# Patient Record
Sex: Female | Born: 1985 | Race: White | Hispanic: No | Marital: Single | Smoking: Current every day smoker
Health system: Southern US, Community
[De-identification: ages and names within clinical notes are randomized; demographics above are authoritative.]

## PROBLEM LIST (undated history)

## (undated) DIAGNOSIS — G43909 Migraine, unspecified, not intractable, without status migrainosus: Secondary | ICD-10-CM

## (undated) DIAGNOSIS — M549 Dorsalgia, unspecified: Secondary | ICD-10-CM

## (undated) DIAGNOSIS — M419 Scoliosis, unspecified: Secondary | ICD-10-CM

## (undated) HISTORY — PX: FOOT SURGERY: SHX648

---

## 2011-03-26 ENCOUNTER — Emergency Department (HOSPITAL_BASED_OUTPATIENT_CLINIC_OR_DEPARTMENT_OTHER)
Admission: EM | Admit: 2011-03-26 | Discharge: 2011-03-26 | Disposition: A | Discharge status: Home / Self Care | Payer: Self-pay | Attending: Emergency Medicine | Admitting: Emergency Medicine

## 2011-03-26 DIAGNOSIS — L03119 Cellulitis of unspecified part of limb: Secondary | ICD-10-CM | POA: Insufficient documentation

## 2011-03-26 DIAGNOSIS — L02219 Cutaneous abscess of trunk, unspecified: Secondary | ICD-10-CM | POA: Insufficient documentation

## 2011-03-26 DIAGNOSIS — L02419 Cutaneous abscess of limb, unspecified: Secondary | ICD-10-CM | POA: Insufficient documentation

## 2011-03-26 DIAGNOSIS — F17200 Nicotine dependence, unspecified, uncomplicated: Secondary | ICD-10-CM | POA: Insufficient documentation

## 2011-03-26 DIAGNOSIS — L03319 Cellulitis of trunk, unspecified: Secondary | ICD-10-CM | POA: Insufficient documentation

## 2011-05-18 ENCOUNTER — Encounter: Payer: Self-pay | Admitting: *Deleted

## 2011-05-18 ENCOUNTER — Emergency Department (HOSPITAL_BASED_OUTPATIENT_CLINIC_OR_DEPARTMENT_OTHER)
Admission: EM | Admit: 2011-05-18 | Discharge: 2011-05-18 | Disposition: A | Discharge status: Home / Self Care | Payer: Self-pay | Attending: Emergency Medicine | Admitting: Emergency Medicine

## 2011-05-18 DIAGNOSIS — F17200 Nicotine dependence, unspecified, uncomplicated: Secondary | ICD-10-CM | POA: Insufficient documentation

## 2011-05-18 DIAGNOSIS — L089 Local infection of the skin and subcutaneous tissue, unspecified: Secondary | ICD-10-CM | POA: Insufficient documentation

## 2011-05-18 MED ORDER — HYDROCODONE-ACETAMINOPHEN 5-325 MG PO TABS: 2.0000 | ORAL_TABLET | ORAL | Status: AC | PRN

## 2011-05-18 MED ORDER — SULFAMETHOXAZOLE-TRIMETHOPRIM 800-160 MG PO TABS: 1.0000 | ORAL_TABLET | Freq: Two times a day (BID) | ORAL | Status: AC

## 2011-05-18 NOTE — ED Provider Notes (Signed)
History     CSN: [id number] Arrival date & time: 05/18/2011  7:53 PM  Chief Complaint  Patient presents with  . Abscess   Patient is a 25 y.o. female presenting with abscess. The history is provided by the patient.  Abscess  This is a new problem. The current episode started more than one week ago. The problem occurs frequently. The problem has been gradually improving. The abscess is present on the right lower leg and left lower leg. The abscess is characterized by itchiness and redness. The patient was exposed to OTC medications. There were no sick contacts. She has received no recent medical care.  Pt complains of red swollen oozing lesions on lower legs,  Pt has had drainage from areas.  Areas are spreading  History reviewed. No pertinent past medical history.  History reviewed. No pertinent past surgical history.  History reviewed. No pertinent family history.  History  Substance Use Topics  . Smoking status: Current Everyday Smoker -- 0.5 packs/day  . Smokeless tobacco: Not on file  . Alcohol Use: No    OB History    Grav Para Term Preterm Abortions TAB SAB Ect Mult Living                  Review of Systems  Musculoskeletal: Positive for myalgias.  Skin: Positive for rash.  All other systems reviewed and are negative.    Physical Exam  BP 120/77  Pulse 104  Temp(Src) 98.6 F (37 C) (Oral)  Resp 16  Wt 125 lb (56.7 kg)  LMP 03/05/2011  Physical Exam  Nursing note and vitals reviewed. Constitutional: She appears well-developed and well-nourished.  HENT:  Head: Normocephalic.  Neck: Normal range of motion.  Cardiovascular: Normal rate.   Pulmonary/Chest: Effort normal.  Abdominal: Soft.  Musculoskeletal: Normal range of motion.       Pt has multiple pustules,  Several larger red areas  Skin: There is erythema.  Psychiatric: She has a normal mood and affect.    ED Course  Procedures  MDM Culture obtained,  I think this is probably mrsa,  I would  like for pt to be rechecked,  She does not have a primary MD.  I advised followup at [facility] in 2 days      Langston Masker, [place] 05/18/11 2043

## 2011-05-18 NOTE — ED Notes (Signed)
Pt c/o several abscess to legs and abd x 4 days

## 2011-05-22 LAB — CULTURE, ROUTINE-ABSCESS: Gram Stain: NONE SEEN

## 2011-06-17 NOTE — ED Provider Notes (Signed)
History     CSN: [id number] Arrival date & time: 05/18/2011  7:53 PM  Chief Complaint  Patient presents with  . Abscess   HPI  History reviewed. No pertinent past medical history.  History reviewed. No pertinent past surgical history.  History reviewed. No pertinent family history.  History  Substance Use Topics  . Smoking status: Current Everyday Smoker -- 0.5 packs/day  . Smokeless tobacco: Not on file  . Alcohol Use: No    OB History    Grav Para Term Preterm Abortions TAB SAB Ect Mult Living                  Review of Systems  Physical Exam  BP 120/77  Pulse 104  Temp(Src) 98.6 F (37 C) (Oral)  Resp 16  Wt 125 lb (56.7 kg)  LMP 03/05/2011  Physical Exam  ED Course  Procedures  MDM Medical screening examination/treatment/procedure(s) were performed by non-physician practitioner and as supervising physician I was immediately available for consultation/collaboration.       Ademola Vert A. Patrica Duel, MD 06/17/11 [phone]

## 2012-05-21 ENCOUNTER — Emergency Department (HOSPITAL_BASED_OUTPATIENT_CLINIC_OR_DEPARTMENT_OTHER): Payer: Self-pay

## 2012-05-21 ENCOUNTER — Emergency Department (HOSPITAL_BASED_OUTPATIENT_CLINIC_OR_DEPARTMENT_OTHER)
Admission: EM | Admit: 2012-05-21 | Discharge: 2012-05-21 | Disposition: A | Discharge status: Home / Self Care | Payer: Self-pay | Attending: Emergency Medicine | Admitting: Emergency Medicine

## 2012-05-21 ENCOUNTER — Encounter (HOSPITAL_BASED_OUTPATIENT_CLINIC_OR_DEPARTMENT_OTHER): Payer: Self-pay | Admitting: Emergency Medicine

## 2012-05-21 DIAGNOSIS — H53149 Visual discomfort, unspecified: Secondary | ICD-10-CM | POA: Insufficient documentation

## 2012-05-21 DIAGNOSIS — R112 Nausea with vomiting, unspecified: Secondary | ICD-10-CM | POA: Insufficient documentation

## 2012-05-21 DIAGNOSIS — R51 Headache: Secondary | ICD-10-CM | POA: Insufficient documentation

## 2012-05-21 HISTORY — DX: Migraine, unspecified, not intractable, without status migrainosus: G43.909

## 2012-05-21 MED ORDER — DIPHENHYDRAMINE HCL 50 MG/ML IJ SOLN
25.0000 mg | Freq: Once | INTRAMUSCULAR | Status: AC
  Administered 2012-05-21: 25 mg via INTRAVENOUS
  Filled 2012-05-21: qty 1

## 2012-05-21 MED ORDER — SODIUM CHLORIDE 0.9 % IV BOLUS (SEPSIS)
1000.0000 mL | Freq: Once | INTRAVENOUS | Status: AC
  Administered 2012-05-21: 1000 mL via INTRAVENOUS

## 2012-05-21 MED ORDER — DEXAMETHASONE SODIUM PHOSPHATE 10 MG/ML IJ SOLN
10.0000 mg | Freq: Once | INTRAMUSCULAR | Status: AC
  Administered 2012-05-21: 10 mg via INTRAVENOUS
  Filled 2012-05-21: qty 1

## 2012-05-21 MED ORDER — METOCLOPRAMIDE HCL 5 MG/ML IJ SOLN
10.0000 mg | Freq: Once | INTRAMUSCULAR | Status: AC
  Administered 2012-05-21: 10 mg via INTRAVENOUS
  Filled 2012-05-21: qty 2

## 2012-05-21 NOTE — ED Provider Notes (Signed)
History     CSN: [id number]  Arrival date & time 05/21/12  1210   First MD Initiated Contact with Patient 05/21/12 1254      Chief Complaint  Patient presents with  . Migraine    (Consider location/radiation/quality/duration/timing/severity/associated sxs/prior treatment) HPI Comments: Patient presents with one-week history of worsening migraine-type headache. She has a history of migraines in the past but they don't typically lasts as long. She has constant throbbing pain to the right side of her head. This is associated with nausea and vomiting. She also has photophobia. She denies any recent head trauma. Denies a fevers or chills. She's been taking over-the-counter medicines including Excedrin without relief. She also got a shot at her doctor's office on Friday with no improvement. She states that she has had migraines in the past but this feels little bit different and worse than her typical headaches. She has some pain going down toward the back of her head  Patient is a 26 y.o. female presenting with migraine. The history is provided by the patient.  Migraine This is a recurrent problem. Associated symptoms include headaches. Pertinent negatives include no chest pain, no abdominal pain and no shortness of breath.    Past Medical History  Diagnosis Date  . Migraine     No past surgical history on file.  No family history on file.  History  Substance Use Topics  . Smoking status: Current Everyday Smoker -- 0.5 packs/day  . Smokeless tobacco: Not on file  . Alcohol Use: No    OB History    Grav Para Term Preterm Abortions TAB SAB Ect Mult Living                  Review of Systems  Constitutional: Negative for fever, chills, diaphoresis and fatigue.  HENT: Negative for congestion, rhinorrhea and sneezing.   Eyes: Negative.   Respiratory: Negative for cough, chest tightness and shortness of breath.   Cardiovascular: Negative for chest pain and leg swelling.    Gastrointestinal: Positive for nausea and vomiting. Negative for abdominal pain, diarrhea and blood in stool.  Genitourinary: Negative for frequency, hematuria, flank pain and difficulty urinating.  Musculoskeletal: Negative for back pain and arthralgias.  Skin: Negative for rash.  Neurological: Positive for headaches. Negative for dizziness, speech difficulty, weakness and numbness.    Allergies  Review of patient's allergies indicates no known allergies.  Home Medications   Current Outpatient Rx  Name Route Sig Dispense Refill  . ASPIRIN-ACETAMINOPHEN-CAFFEINE 250-250-65 MG PO TABS Oral Take 1 tablet by mouth every 6 (six) hours as needed.    Marland Kitchen PSEUDOEPHEDRINE HCL 60 MG PO TABS Oral Take 60 mg by mouth every 4 (four) hours as needed.      BP 117/73  Pulse 87  Temp 98.3 F (36.8 C) (Oral)  Resp 16  Ht 5\' 7"  (1.702 m)  Wt 125 lb (56.7 kg)  BMI 19.58 kg/m2  SpO2 100%  LMP 05/14/2012  Physical Exam  Constitutional: She is oriented to person, place, and time. She appears well-developed and well-nourished.  HENT:  Head: Normocephalic and atraumatic.  Eyes: Pupils are equal, round, and reactive to light.  Neck: Normal range of motion. Neck supple.  Cardiovascular: Normal rate, regular rhythm and normal heart sounds.   Pulmonary/Chest: Effort normal and breath sounds normal. No respiratory distress. She has no wheezes. She has no rales. She exhibits no tenderness.  Abdominal: Soft. Bowel sounds are normal. There is no tenderness. There is no rebound  and no guarding.  Musculoskeletal: Normal range of motion. She exhibits no edema.  Lymphadenopathy:    She has no cervical adenopathy.  Neurological: She is alert and oriented to person, place, and time. She has normal strength. No cranial nerve deficit or sensory deficit. GCS eye subscore is 4. GCS verbal subscore is 5. GCS motor subscore is 6.       Finger to nose intact  Skin: Skin is warm and dry. No rash noted.  Psychiatric:  She has a normal mood and affect.    ED Course  Procedures (including critical care time)   Ct Head Wo Contrast  05/21/2012  *RADIOLOGY REPORT*  Clinical Data: Frontal headaches since Monday.  Nausea, vomiting. Photophobia.  History of migraines.  No known trauma or history of hypertension.  CT HEAD WITHOUT CONTRAST  Technique:  Contiguous axial images were obtained from the base of the skull through the vertex without contrast.  Comparison: None.  Findings: There is no intra or extra-axial fluid collection or mass lesion.  The basilar cisterns and ventricles have a normal appearance.  There is no CT evidence for acute infarction or hemorrhage.  Bone windows show no calvarial fracture.  Visualized paranasal and mastoid air cells are well-aerated.  IMPRESSION: Negative exam.  Original Report Authenticated By: Patterson Hammersmith, M.D.      1. Headache       MDM  Pt with headache different than her past migraines.  CT negative.  Feeling a little better after migraine cocktail.  I recommended LP to r/o SAH, meningitis, and pt initially agreed, but now is refusing.  I explained risks of not doing LP and she is still refusing.  Advised to f/u with her PMD tomorrow or return here if symptoms worsen or she changes her mind about LP        Rolan Bucco, MD 05/21/12 1419

## 2012-05-21 NOTE — ED Notes (Signed)
Pt refusing LP- Dr. Fredderick Phenix notified.

## 2012-05-21 NOTE — ED Notes (Addendum)
Pt having headache since Monday.  Actively N/V this week.  No blurry vision.  Pt states this headache is different.  Sensitive to light and sound.   Seen at doctors office Friday, received a shot but has not helped any.

## 2015-03-28 ENCOUNTER — Encounter (HOSPITAL_BASED_OUTPATIENT_CLINIC_OR_DEPARTMENT_OTHER): Payer: Self-pay

## 2015-03-28 ENCOUNTER — Emergency Department (HOSPITAL_BASED_OUTPATIENT_CLINIC_OR_DEPARTMENT_OTHER)
Admission: EM | Admit: 2015-03-28 | Discharge: 2015-03-28 | Disposition: A | Discharge status: Home / Self Care | Payer: Self-pay | Attending: Emergency Medicine | Admitting: Emergency Medicine

## 2015-03-28 ENCOUNTER — Emergency Department (HOSPITAL_BASED_OUTPATIENT_CLINIC_OR_DEPARTMENT_OTHER): Payer: Self-pay

## 2015-03-28 DIAGNOSIS — Z8679 Personal history of other diseases of the circulatory system: Secondary | ICD-10-CM | POA: Insufficient documentation

## 2015-03-28 DIAGNOSIS — Z720 Tobacco use: Secondary | ICD-10-CM | POA: Insufficient documentation

## 2015-03-28 DIAGNOSIS — M722 Plantar fascial fibromatosis: Secondary | ICD-10-CM | POA: Insufficient documentation

## 2015-03-28 DIAGNOSIS — M419 Scoliosis, unspecified: Secondary | ICD-10-CM | POA: Insufficient documentation

## 2015-03-28 HISTORY — DX: Dorsalgia, unspecified: M54.9

## 2015-03-28 HISTORY — DX: Scoliosis, unspecified: M41.9

## 2015-03-28 MED ORDER — NAPROXEN 500 MG PO TABS: 500.0000 mg | ORAL_TABLET | Freq: Two times a day (BID) | ORAL | Status: AC

## 2015-03-28 NOTE — ED Provider Notes (Signed)
CSN: [id number]     Arrival date & time 03/28/15  1311 History   First MD Initiated Contact with Patient 03/28/15 1319     Chief Complaint  Patient presents with  . Foot Injury     (Consider location/radiation/quality/duration/timing/severity/associated sxs/prior Treatment) HPI Comments: Pt was a water park all day prior to pain starting.  Stating she was barefoot all day on concrete and that night when got home developed pain in the foot that has continued to worsen and severe when attempting to walk.  Patient is a 29 y.o. female presenting with lower extremity pain. The history is provided by the patient.  Foot Pain This is a new problem. Episode onset: 6 days ago. The problem occurs constantly. The problem has not changed since onset.Associated symptoms comments: Pain in the left foot that is worse when trying to walk. The symptoms are aggravated by walking. The symptoms are relieved by rest. She has tried acetaminophen for the symptoms. The treatment provided no relief.    Past Medical History  Diagnosis Date  . Migraine   . Back pain   . Scoliosis    Past Surgical History  Procedure Laterality Date  . Foot surgery     No family history on file. History  Substance Use Topics  . Smoking status: Current Every Day Smoker -- 0.00 packs/day  . Smokeless tobacco: Not on file  . Alcohol Use: No   OB History    No data available     Review of Systems  All other systems reviewed and are negative.     Allergies  Review of patient's allergies indicates no known allergies.  Home Medications   Prior to Admission medications   Medication Sig Start Date End Date Taking? Authorizing Provider  naproxen (NAPROSYN) 500 MG tablet Take 1 tablet (500 mg total) by mouth 2 (two) times daily. 03/28/15   Gwyneth Sprout, MD   BP 119/78 mmHg  Pulse 88  Temp(Src) 98.3 F (36.8 C) (Oral)  Resp 18  Ht 5\' 6"  (1.676 m)  Wt 127 lb (57.607 kg)  BMI 20.51 kg/m2  SpO2 100%  LMP  02/03/2015 (LMP Unknown) Physical Exam  Constitutional: She is oriented to person, place, and time. She appears well-developed and well-nourished. No distress.  HENT:  Head: Normocephalic and atraumatic.  Cardiovascular: Normal rate.   Pulmonary/Chest: Effort normal.  Musculoskeletal:       Left foot: There is tenderness. There is normal range of motion, no bony tenderness, normal capillary refill and no deformity.       Feet:  Tenderness to palpation of the plantar fascia  Neurological: She is alert and oriented to person, place, and time.  Skin: Skin is warm and dry.  Psychiatric: She has a normal mood and affect. Her behavior is normal.  Nursing note and vitals reviewed.   ED Course  Procedures (including critical care time) Labs Review Labs Reviewed - No data to display  Imaging Review Dg Foot Complete Left  03/28/2015   CLINICAL DATA:  Trauma Pattricia Boss pool to left foot 1 week ago. Persistent pain and swelling.  EXAM: LEFT FOOT - COMPLETE 3+ VIEW  COMPARISON:  None.  FINDINGS: There is no evidence of fracture or dislocation. There is no evidence of arthropathy or other focal bone abnormality. Soft tissues are unremarkable.  IMPRESSION: Negative left foot radiographs.   Electronically Signed   By: Marin Roberts M.D.   On: 03/28/2015 13:52     EKG Interpretation None  MDM   Final diagnoses:  Plantar fasciitis   patient presenting with persistent left foot pain after she was at a water park all day barefoot it on the concrete. Pain is in the arch of the foot and on the top of the foot. Is much worse when attempting to walk. She has significant tenderness along the plantar fascia. Feel most likely the patient has plantar fasciitis. Imaging is negative. Otherwise normal appearance.  Encouraged patient to get arch support for her shoes and given naproxen.    Gwyneth Sprout, MD 03/28/15 1421

## 2015-03-28 NOTE — ED Notes (Signed)
Injured left foot 7 days ago

## 2015-03-28 NOTE — Discharge Instructions (Signed)
Plantar Fasciitis  Plantar fasciitis is a common condition that causes foot pain. It is soreness (inflammation) of the band of tough fibrous tissue on the bottom of the foot that runs from the heel bone (calcaneus) to the ball of the foot. The cause of this soreness may be from excessive standing, poor fitting shoes, running on hard surfaces, being overweight, having an abnormal walk, or overuse (this is common in runners) of the painful foot or feet. It is also common in aerobic exercise dancers and ballet dancers.  SYMPTOMS   Most people with plantar fasciitis complain of:   Severe pain in the morning on the bottom of their foot especially when taking the first steps out of bed. This pain recedes after a few minutes of walking.   Severe pain is experienced also during walking following a long period of inactivity.   Pain is worse when walking barefoot or up stairs  DIAGNOSIS    Your caregiver will diagnose this condition by examining and feeling your foot.   Special tests such as X-rays of your foot, are usually not needed.  PREVENTION    Consult a sports medicine professional before beginning a new exercise program.   Walking programs offer a good workout. With walking there is a lower chance of overuse injuries common to runners. There is less impact and less jarring of the joints.   Begin all new exercise programs slowly. If problems or pain develop, decrease the amount of time or distance until you are at a comfortable level.   Wear good shoes and replace them regularly.   Stretch your foot and the heel cords at the back of the ankle (Achilles tendon) both before and after exercise.   Run or exercise on even surfaces that are not hard. For example, asphalt is better than pavement.   Do not run barefoot on hard surfaces.   If using a treadmill, vary the incline.   Do not continue to workout if you have foot or joint problems. Seek professional help if they do not improve.  HOME CARE INSTRUCTIONS     Avoid activities that cause you pain until you recover.   Use ice or cold packs on the problem or painful areas after working out.   Only take over-the-counter or prescription medicines for pain, discomfort, or fever as directed by your caregiver.   Soft shoe inserts or athletic shoes with air or gel sole cushions may be helpful.   If problems continue or become more severe, consult a sports medicine caregiver or your own health care provider. Cortisone is a potent anti-inflammatory medication that may be injected into the painful area. You can discuss this treatment with your caregiver.  MAKE SURE YOU:    Understand these instructions.   Will watch your condition.   Will get help right away if you are not doing well or get worse.  Document Released: 06/15/2001 Document Revised: 12/13/2011 Document Reviewed: 08/14/2008  ExitCare Patient Information 2015 [employer]. This information is not intended to replace advice given to you by your health care provider. Make sure you discuss any questions you have with your health care provider.

## 2016-10-07 ENCOUNTER — Emergency Department (HOSPITAL_BASED_OUTPATIENT_CLINIC_OR_DEPARTMENT_OTHER): Payer: BLUE CROSS/BLUE SHIELD

## 2016-10-07 ENCOUNTER — Encounter (HOSPITAL_BASED_OUTPATIENT_CLINIC_OR_DEPARTMENT_OTHER): Payer: Self-pay | Admitting: *Deleted

## 2016-10-07 ENCOUNTER — Emergency Department (HOSPITAL_BASED_OUTPATIENT_CLINIC_OR_DEPARTMENT_OTHER)
Admission: EM | Admit: 2016-10-07 | Discharge: 2016-10-07 | Disposition: A | Discharge status: Home / Self Care | Payer: BLUE CROSS/BLUE SHIELD | Attending: Emergency Medicine | Admitting: Emergency Medicine

## 2016-10-07 DIAGNOSIS — J209 Acute bronchitis, unspecified: Secondary | ICD-10-CM

## 2016-10-07 DIAGNOSIS — R112 Nausea with vomiting, unspecified: Secondary | ICD-10-CM | POA: Diagnosis present

## 2016-10-07 DIAGNOSIS — F17200 Nicotine dependence, unspecified, uncomplicated: Secondary | ICD-10-CM | POA: Insufficient documentation

## 2016-10-07 MED ORDER — ONDANSETRON 8 MG PO TBDP: ORAL_TABLET | ORAL | 0 refills | Status: DC

## 2016-10-07 MED ORDER — AZITHROMYCIN 250 MG PO TABS: ORAL_TABLET | ORAL | 0 refills | Status: DC

## 2016-10-07 NOTE — ED Provider Notes (Signed)
[facility]   CSN: [id number] Arrival date & time: 10/07/16  1733     History   Chief Complaint Chief Complaint  Patient presents with  . Emesis    HPI Ashley Trevino is a 31 y.o. female.  Patient is a 30 year old female with past medical history of migraines, alcoholism. She presents for evaluation of chest congestion and cough that has been ongoing for the past 3 weeks. She was seen at an outside facility and given prednisone and an inhaler which has not helped.   The history is provided by the patient.  Emesis   This is a new problem. The current episode started less than 1 hour ago. The problem has not changed since onset.There has been no fever.    Past Medical History:  Diagnosis Date  . Back pain   . Migraine   . Scoliosis     There are no active problems to display for this patient.   Past Surgical History:  Procedure Laterality Date  . FOOT SURGERY      OB History    No data available       Home Medications    Prior to Admission medications   Medication Sig Start Date End Date Taking? Authorizing Provider  naproxen (NAPROSYN) 500 MG tablet Take 1 tablet (500 mg total) by mouth 2 (two) times daily. 03/28/15   Gwyneth Sprout, MD    Family History History reviewed. No pertinent family history.  Social History Social History  Substance Use Topics  . Smoking status: Current Every Day Smoker    Packs/day: 0.00  . Smokeless tobacco: Never Used  . Alcohol use No     Allergies   Patient has no known allergies.   Review of Systems Review of Systems  Gastrointestinal: Positive for vomiting.  All other systems reviewed and are negative.    Physical Exam Updated Vital Signs BP 129/91 (BP Location: Right Arm)   Pulse (!) 126   Temp 98.3 F (36.8 C) (Oral)   Resp 20   Ht 5\' 6"  (1.676 m)   Wt 125 lb (56.7 kg)   LMP 09/27/2016   SpO2 100%   BMI 20.18 kg/m   Physical Exam  Constitutional: She is oriented to  person, place, and time. She appears well-developed and well-nourished. No distress.  HENT:  Head: Normocephalic and atraumatic.  Mouth/Throat: Oropharynx is clear and moist.  Neck: Normal range of motion. Neck supple.  Cardiovascular: Normal rate and regular rhythm.  Exam reveals no gallop and no friction rub.   No murmur heard. Pulmonary/Chest: Effort normal and breath sounds normal. No respiratory distress. She has no wheezes.  Abdominal: Soft. Bowel sounds are normal. She exhibits no distension. There is no tenderness.  Musculoskeletal: Normal range of motion.  Neurological: She is alert and oriented to person, place, and time.  Skin: Skin is warm and dry. She is not diaphoretic.  Nursing note and vitals reviewed.    ED Treatments / Results  Labs (all labs ordered are listed, but only abnormal results are displayed) Labs Reviewed - No data to display  EKG  EKG Interpretation None       Radiology No results found.  Procedures Procedures (including critical care time)  Medications Ordered in ED Medications - No data to display   Initial Impression / Assessment and Plan / ED Course  I have reviewed the triage vital signs and the nursing notes.  Pertinent labs & imaging results that were available during  my care of the patient were reviewed by me and considered in my medical decision making (see chart for details).  Clinical Course     X-rays clear, however I will prescribe Zithromax as the patient reports symptoms persistently for the past 3 weeks. She appears well-hydrated and in no distress. She is to return as needed for any problems.  Final Clinical Impressions(s) / ED Diagnoses   Final diagnoses:  None    New Prescriptions New Prescriptions   No medications on file     Geoffery Lyonsouglas Tahira Olivarez, MD 10/07/16 1859

## 2016-10-07 NOTE — ED Notes (Signed)
ED Provider at bedside. 

## 2016-10-07 NOTE — ED Triage Notes (Signed)
Patient is alert and oriented x4.  She is complaining of nausea, vomiting and diarrhea that started 3 weeks ago.  She was recently seen in [place] and Dx with COPD.  Currently she rates her rib pain from coughing 10 of 10.

## 2016-10-07 NOTE — Discharge Instructions (Signed)
Zithromax as prescribed.  Zofran as prescribed as needed for nausea.  Continue over-the-counter medications as needed for symptomatic relief.  Follow-up with your primary Dr. if not improving in the next week.

## 2018-03-14 ENCOUNTER — Other Ambulatory Visit: Payer: Self-pay

## 2018-03-14 ENCOUNTER — Emergency Department (HOSPITAL_BASED_OUTPATIENT_CLINIC_OR_DEPARTMENT_OTHER)
Admission: EM | Admit: 2018-03-14 | Discharge: 2018-03-14 | Disposition: A | Discharge status: Home / Self Care | Payer: BLUE CROSS/BLUE SHIELD | Attending: Emergency Medicine | Admitting: Emergency Medicine

## 2018-03-14 ENCOUNTER — Encounter (HOSPITAL_BASED_OUTPATIENT_CLINIC_OR_DEPARTMENT_OTHER): Payer: Self-pay | Admitting: Emergency Medicine

## 2018-03-14 DIAGNOSIS — M7918 Myalgia, other site: Secondary | ICD-10-CM | POA: Diagnosis not present

## 2018-03-14 DIAGNOSIS — R21 Rash and other nonspecific skin eruption: Secondary | ICD-10-CM | POA: Diagnosis not present

## 2018-03-14 DIAGNOSIS — F17210 Nicotine dependence, cigarettes, uncomplicated: Secondary | ICD-10-CM | POA: Insufficient documentation

## 2018-03-14 DIAGNOSIS — L299 Pruritus, unspecified: Secondary | ICD-10-CM | POA: Diagnosis not present

## 2018-03-14 MED ORDER — DIPHENHYDRAMINE HCL 25 MG PO TABS: 25.0000 mg | ORAL_TABLET | ORAL | 0 refills | Status: AC | PRN

## 2018-03-14 MED ORDER — IBUPROFEN 800 MG PO TABS: 800.0000 mg | ORAL_TABLET | Freq: Three times a day (TID) | ORAL | 0 refills | Status: AC | PRN

## 2018-03-14 MED ORDER — DOXYCYCLINE HYCLATE 100 MG PO CAPS: 100.0000 mg | ORAL_CAPSULE | Freq: Two times a day (BID) | ORAL | 0 refills | Status: AC

## 2018-03-14 MED FILL — DOXYCYCLINE HYCLATE 100 MG: 100 | 10 days supply | Qty: 20 | Fill #0

## 2018-03-14 MED FILL — IBUPROFEN 800 MG TAB: 800 | 10 days supply | Qty: 30 | Fill #0

## 2018-03-14 MED FILL — BANOPHEN 25 MG CAPSULE: 25 | 17 days supply | Qty: 100 | Fill #0

## 2018-03-14 NOTE — ED Provider Notes (Signed)
[facility] EMERGENCY DEPARTMENT Provider Note   CSN: [id number] Arrival date & time: 03/14/18  1504     History   Chief Complaint Chief Complaint  Patient presents with  . Rash    HPI Ashley Trevino is a 32 y.o. female with a hx of tobacco abuse, back pain, scoliosis, and migraines who presents to the ED with complaints of rash for the past 1.5 weeks. Patient states she has had multiple small red areas to her entire body. Describes areas as somewhat itchy, she has been scratching, they are somewhat painful. She states they are waxing/waning. She has tried OTC topical hydrocortisone for these without significant relief.  Reports associated generalized achiness, she has felt warm at times but has not taken a temperature to evaluate for fever. She states that she has slept in a new location intermittently lately- family member's house, it is thought that the dog has fleas. No one else with similar rash. Her mother tried giving her amoxicillin for past few days without change, she states she thought it may help, no clear reason for this.  She has not noticed any tic exposure but states that she did go fishing within past 2 weeks so was in wooded area. Denies throat closing sensation, dyspnea, or chest pain.   HPI  Past Medical History:  Diagnosis Date  . Back pain   . Migraine   . Scoliosis     There are no active problems to display for this patient.   Past Surgical History:  Procedure Laterality Date  . FOOT SURGERY       OB History   None      Home Medications    Prior to Admission medications   Medication Sig Start Date End Date Taking? Authorizing Provider  naproxen (NAPROSYN) 500 MG tablet Take 1 tablet (500 mg total) by mouth 2 (two) times daily. 03/28/15   Gwyneth Sprout, MD  ondansetron (ZOFRAN ODT) 8 MG disintegrating tablet 8mg  ODT q4 hours prn nausea 10/07/16   Geoffery Lyons, MD    Family History No family history on file.  Social  History Social History   Tobacco Use  . Smoking status: Current Every Day Smoker    Packs/day: 0.00  . Smokeless tobacco: Never Used  Substance Use Topics  . Alcohol use: No  . Drug use: No     Allergies   Patient has no known allergies.   Review of Systems Review of Systems  Constitutional: Positive for fever ("felt warm" no measured temp). Negative for appetite change.  Respiratory: Negative for shortness of breath.   Cardiovascular: Negative for chest pain.  Musculoskeletal: Positive for myalgias.  Skin: Positive for rash.  Neurological: Negative for weakness, numbness and headaches.   Physical Exam Updated Vital Signs BP 132/88 (BP Location: Left Arm)   Pulse (!) 110   Temp 98.3 F (36.8 C) (Oral)   Resp 18   Ht 5\' 6"  (1.676 m)   Wt 52.2 kg (115 lb)   LMP 03/07/2018   SpO2 99%   BMI 18.56 kg/m   Physical Exam  Constitutional: She appears well-developed and well-nourished.  Non-toxic appearance. No distress.  HENT:  Head: Normocephalic and atraumatic.  Right Ear: Tympanic membrane normal. Tympanic membrane is not perforated, not erythematous, not retracted and not bulging.  Left Ear: Tympanic membrane normal. Tympanic membrane is not perforated, not erythematous, not retracted and not bulging.  Nose: Nose normal.  Mouth/Throat: Uvula is midline, oropharynx is clear and moist and mucous  membranes are normal.  No mucous membranes lesions. No sloughing. Patient tolerating her own secretions without difficulty. No trismus. No drooling. No hot potato voice. Airway is patent.   Eyes: Conjunctivae are normal. Right eye exhibits no discharge. Left eye exhibits no discharge.  Neck: Normal range of motion. Neck supple.  Cardiovascular: Normal rate and regular rhythm.  No murmur heard. Pulses:      Radial pulses are 2+ on the right side, and 2+ on the left side.       Dorsalis pedis pulses are 2+ on the right side, and 2+ on the left side.  Pulmonary/Chest: Effort  normal and breath sounds normal. No stridor. No respiratory distress. She has no wheezes. She has no rhonchi. She has no rales.  Abdominal: Soft. Normal appearance. She exhibits no distension. There is no tenderness.  Musculoskeletal:  No obvious deformities or appreciable swelling,. normal ROM. No midline tenderness to back.   Neurological: She is alert.  Clear speech.  Sensation grossly intact bilateral upper and lower extremities.  Patient has 5 out of 5 symmetric grip strength.  She has 5 out of 5 strength with plantar dorsiflexion.  Gait is steady and intact.  Skin: Skin is warm and dry. No petechiae and no purpura noted. Rash is not nodular, not pustular, not vesicular and not urticarial.  Patient has multiple scabbed over areas, somewhat papule like diffusely scattered to body (extremities, trunk, base of the neck), these do not extend to palms, soles, or inter-webspaces.  No mucous membrane involvement.  Some areas do appear scabbed over.  There is some mild surrounding redness at some sites that appear to have excoriations from scratching.  Not particularly burrowing appearance.  No overlying warmth.  Erythema blanches.  Psychiatric: She has a normal mood and affect. Her behavior is normal.  Nursing note and vitals reviewed.    ED Treatments / Results  Labs (all labs ordered are listed, but only abnormal results are displayed) Labs Reviewed - No data to display  EKG None  Radiology No results found.  Procedures Procedures (including critical care time)  Medications Ordered in ED Medications - No data to display   Initial Impression / Assessment and Plan / ED Course  I have reviewed the triage vital signs and the nursing notes.  Pertinent labs & imaging results that were available during my care of the patient were reviewed by me and considered in my medical decision making (see chart for details).   Patient presents with her mother for rash which has been ongoing for 1.5  weeks.  Patient nontoxic-appearing, no apparent distress, initial vitals notable for tachycardia, this normalized on my exam and with repeat vitals.  Patient has multiple erythematous papules, majority appear scabbed over.  These are pruritic.  Presentation somewhat suspicious for bedbugs, does not appear consistent with scabies, no inter-web space involvement, does not have burrowing appearance.  Recommended topical hydrocortisone cream, Benadryl as needed itching, and home hygiene to eradicate bedbugs.  However, given patient was in wooded areas,she has had some flu like sxs, will additionally cover for RMSF with Doxycycline. I discussed treatment plan, need for PCP follow-up, and return precautions with the patient. Provided opportunity for questions, patient confirmed understanding and is in agreement with plan.   Vitals:   03/14/18 1509 03/14/18 1656  BP: 132/88 113/72  Pulse: (!) 110 93  Resp: 18   Temp: 98.3 F (36.8 C)   TempSrc: Oral   SpO2: 99% 100%  Weight: 52.2 kg (115  lb)   Height: 5\' 6"  (1.676 m)     Final Clinical Impressions(s) / ED Diagnoses   Final diagnoses:  Rash    ED Discharge Orders        Ordered    doxycycline (VIBRAMYCIN) 100 MG capsule  2 times daily     03/14/18 1706    diphenhydrAMINE (BENADRYL) 25 MG tablet  Every 4 hours PRN     03/14/18 1706    ibuprofen (ADVIL,MOTRIN) 800 MG tablet  Every 8 hours PRN     03/14/18 1706       Brenda Cowher, EdgewaterSamantha R, PA-C 03/14/18 1824    Tegeler, Canary Brimhristopher J, MD 03/15/18 0040

## 2018-03-14 NOTE — ED Notes (Signed)
ED Provider at bedside. 

## 2018-03-14 NOTE — ED Triage Notes (Addendum)
Red itchy rash to entire body. States she thinks she has flea bites. Also states "I need to know why my veins are popping out" and c/o difficulty bending L arm. Pt states she has been taking old amoxicillin for 3 days.

## 2018-03-14 NOTE — Discharge Instructions (Signed)
You were seen in the emergency department today for a rash, this appears suspicious for bedbugs.  Be sure to wash all linens and clothing in hot water, additionally consider hiring an exterminator to eradicate the bedbugs.  Continue to apply topical hydrocortisone cream as needed for itching.  You may also take Benadryl every 4 hours as needed for itching.  Given you prescription for Benadryl we have also giving a prescription for ibuprofen, take this every 8 hours as needed for pain, do not take this is other NSAIDs, be sure to take this with food as it can cause stomach upset and at worst GI bleeds.  May take Tylenol per over-the-counter dosing with this medication safely.  Additionally we are covering your for possible rocky mountain spotted fever with doxycycline, this is an antibiotic. Please take all of your antibiotics until finished. You may develop abdominal discomfort or diarrhea from the antibiotic.  You may help offset this with probiotics which you can buy at the store (ask your pharmacist if unable to find) or get probiotics in the form of eating yogurt. Do not eat or take the probiotics until 2 hours after your antibiotic. If you are unable to tolerate these side effects follow-up with your primary care provider or return to the emergency department.   If you begin to experience any blistering, new rashes, swelling, or difficulty breathing seek medical care for evaluation of potentially more serious side effects.   Please be aware that this medication may interact with other medications you are taking, please be sure to discuss your medication list with your pharmacist.   Please take all medications as prescribed.  Please discuss all medications with your pharmacist regarding interactions with other medications and potential side effects.  Follow-up with your primary care provider of the [facility] in 3 to 5 days for reevaluation.  Return to the ER for new or worsening  symptoms or any other concerns that you may have.

## 2018-04-14 ENCOUNTER — Encounter (HOSPITAL_COMMUNITY): Payer: Self-pay | Admitting: Emergency Medicine

## 2018-04-14 ENCOUNTER — Other Ambulatory Visit: Payer: Self-pay

## 2018-04-14 ENCOUNTER — Emergency Department (HOSPITAL_COMMUNITY)
Admission: EM | Admit: 2018-04-14 | Discharge: 2018-04-14 | Disposition: A | Discharge status: Home / Self Care | Payer: BLUE CROSS/BLUE SHIELD | Attending: Emergency Medicine | Admitting: Emergency Medicine

## 2018-04-14 DIAGNOSIS — Z5321 Procedure and treatment not carried out due to patient leaving prior to being seen by health care provider: Secondary | ICD-10-CM | POA: Diagnosis not present

## 2018-04-14 DIAGNOSIS — R21 Rash and other nonspecific skin eruption: Secondary | ICD-10-CM | POA: Insufficient documentation

## 2018-04-14 NOTE — ED Notes (Addendum)
When staff told pt she was being placed in a room, pt stated that she would rather go home. This nurse told the pt if she can wait just a little bit longer, a physician would be seeing her in the room shortly. Pt stated "If I'm not dying like I thought I was then I'm leaving. I just want to get in my bed. My chest always hurts even if I'm not moving. I just want to go home.

## 2018-04-14 NOTE — ED Triage Notes (Signed)
Pt arriving with complaint of bed bug bites. Pt states she feels as if a bed bug may be in her ear. Pt reports seven dust was put down in order to control bed bugs ans she believes some of it got in her mouth. Pt reports taking Vivance (unknown amount) last night. Pt presenting with tremors, scrambled conversation, and paranoia.

## 2019-07-29 ENCOUNTER — Emergency Department (HOSPITAL_BASED_OUTPATIENT_CLINIC_OR_DEPARTMENT_OTHER): Payer: Self-pay

## 2019-07-29 ENCOUNTER — Emergency Department (HOSPITAL_BASED_OUTPATIENT_CLINIC_OR_DEPARTMENT_OTHER)
Admission: EM | Admit: 2019-07-29 | Discharge: 2019-07-30 | Disposition: A | Discharge status: Home / Self Care | Payer: Self-pay | Attending: Emergency Medicine | Admitting: Emergency Medicine

## 2019-07-29 ENCOUNTER — Other Ambulatory Visit: Payer: Self-pay

## 2019-07-29 ENCOUNTER — Encounter (HOSPITAL_BASED_OUTPATIENT_CLINIC_OR_DEPARTMENT_OTHER): Payer: Self-pay

## 2019-07-29 DIAGNOSIS — E876 Hypokalemia: Secondary | ICD-10-CM | POA: Insufficient documentation

## 2019-07-29 DIAGNOSIS — F17210 Nicotine dependence, cigarettes, uncomplicated: Secondary | ICD-10-CM | POA: Insufficient documentation

## 2019-07-29 DIAGNOSIS — F1390 Sedative, hypnotic, or anxiolytic use, unspecified, uncomplicated: Secondary | ICD-10-CM | POA: Insufficient documentation

## 2019-07-29 DIAGNOSIS — R252 Cramp and spasm: Secondary | ICD-10-CM | POA: Insufficient documentation

## 2019-07-29 DIAGNOSIS — K047 Periapical abscess without sinus: Secondary | ICD-10-CM | POA: Insufficient documentation

## 2019-07-29 DIAGNOSIS — T148XXA Other injury of unspecified body region, initial encounter: Secondary | ICD-10-CM

## 2019-07-29 DIAGNOSIS — R112 Nausea with vomiting, unspecified: Secondary | ICD-10-CM | POA: Insufficient documentation

## 2019-07-29 DIAGNOSIS — R233 Spontaneous ecchymoses: Secondary | ICD-10-CM | POA: Insufficient documentation

## 2019-07-29 DIAGNOSIS — F1190 Opioid use, unspecified, uncomplicated: Secondary | ICD-10-CM | POA: Insufficient documentation

## 2019-07-29 LAB — COMPREHENSIVE METABOLIC PANEL
ALT: 19 U/L (ref 0–44)
AST: 27 U/L (ref 15–41)
Albumin: 3 g/dL — ABNORMAL LOW (ref 3.5–5.0)
Alkaline Phosphatase: 71 U/L (ref 38–126)
Anion gap: 9 (ref 5–15)
BUN: 5 mg/dL — ABNORMAL LOW (ref 6–20)
CO2: 23 mmol/L (ref 22–32)
Calcium: 8.3 mg/dL — ABNORMAL LOW (ref 8.9–10.3)
Chloride: 105 mmol/L (ref 98–111)
Creatinine, Ser: 0.59 mg/dL (ref 0.44–1.00)
GFR calc Af Amer: >60 mL/min (ref >60)
GFR calc non Af Amer: >60 mL/min (ref >60)
Glucose, Bld: 107 mg/dL — ABNORMAL HIGH (ref 70–99)
Potassium: 2.9 mmol/L — ABNORMAL LOW (ref 3.5–5.1)
Sodium: 137 mmol/L (ref 135–145)
Total Bilirubin: 0.5 mg/dL (ref 0.3–1.2)
Total Protein: 5.8 g/dL — ABNORMAL LOW (ref 6.5–8.1)

## 2019-07-29 LAB — CBC WITH DIFFERENTIAL/PLATELET
Abs Immature Granulocytes: 0.02 10*3/uL (ref 0.00–0.07)
Basophils Absolute: 0.1 10*3/uL (ref 0.0–0.1)
Basophils Relative: 1 %
Eosinophils Absolute: 0.2 10*3/uL (ref 0.0–0.5)
Eosinophils Relative: 3 %
HCT: 41.3 % (ref 36.0–46.0)
Hemoglobin: 13 g/dL (ref 12.0–15.0)
Immature Granulocytes: 0 %
Lymphocytes Relative: 37 %
Lymphs Abs: 2.5 10*3/uL (ref 0.7–4.0)
MCH: 32.3 pg (ref 26.0–34.0)
MCHC: 31.5 g/dL (ref 30.0–36.0)
MCV: 102.5 fL — ABNORMAL HIGH (ref 80.0–100.0)
Monocytes Absolute: 0.4 10*3/uL (ref 0.1–1.0)
Monocytes Relative: 6 %
Neutro Abs: 3.7 10*3/uL (ref 1.7–7.7)
Neutrophils Relative %: 53 %
Platelets: 293 10*3/uL (ref 150–400)
RBC: 4.03 MIL/uL (ref 3.87–5.11)
RDW: 14.6 % (ref 11.5–15.5)
WBC: 7 10*3/uL (ref 4.0–10.5)
nRBC: 0 % (ref 0.0–0.2)

## 2019-07-29 LAB — URINALYSIS, ROUTINE W REFLEX MICROSCOPIC
Bilirubin Urine: NEGATIVE
Glucose, UA: NEGATIVE mg/dL
Hgb urine dipstick: NEGATIVE
Ketones, ur: NEGATIVE mg/dL
Leukocytes,Ua: NEGATIVE
Nitrite: NEGATIVE
Protein, ur: NEGATIVE mg/dL
Specific Gravity, Urine: 1.01 (ref 1.005–1.030)
pH: 8.5 — ABNORMAL HIGH (ref 5.0–8.0)

## 2019-07-29 LAB — RAPID URINE DRUG SCREEN, HOSP PERFORMED
Amphetamines: NOT DETECTED
Barbiturates: NOT DETECTED
Benzodiazepines: POSITIVE — AB
Cocaine: NOT DETECTED
Opiates: POSITIVE — AB
Tetrahydrocannabinol: NOT DETECTED

## 2019-07-29 LAB — MAGNESIUM: Magnesium: 1.9 mg/dL (ref 1.7–2.4)

## 2019-07-29 LAB — PREGNANCY, URINE: Preg Test, Ur: NEGATIVE

## 2019-07-29 LAB — PROTIME-INR
INR: 1 (ref 0.8–1.2)
Prothrombin Time: 13.1 seconds (ref 11.4–15.2)

## 2019-07-29 LAB — CK: Total CK: 97 U/L (ref 38–234)

## 2019-07-29 LAB — AMMONIA: Ammonia: 9 umol/L (ref 9–35)

## 2019-07-29 LAB — TROPONIN I (HIGH SENSITIVITY): Troponin I (High Sensitivity): 2 ng/L (ref <18)

## 2019-07-29 LAB — BRAIN NATRIURETIC PEPTIDE: B Natriuretic Peptide: 160.6 pg/mL — ABNORMAL HIGH (ref 0.0–100.0)

## 2019-07-29 LAB — LIPASE, BLOOD: Lipase: 30 U/L (ref 11–51)

## 2019-07-29 MED ORDER — ONDANSETRON 4 MG PO TBDP: 4.0000 mg | ORAL_TABLET | Freq: Three times a day (TID) | ORAL | 0 refills | Status: AC | PRN

## 2019-07-29 MED ORDER — POTASSIUM CHLORIDE CRYS ER 20 MEQ PO TBCR
40.0000 meq | EXTENDED_RELEASE_TABLET | Freq: Once | ORAL | Status: AC
  Administered 2019-07-29: 40 meq via ORAL
  Filled 2019-07-29: qty 2

## 2019-07-29 MED ORDER — POTASSIUM CHLORIDE CRYS ER 20 MEQ PO TBCR: 40.0000 meq | EXTENDED_RELEASE_TABLET | Freq: Every day | ORAL | 0 refills | Status: AC

## 2019-07-29 MED ORDER — ONDANSETRON HCL 4 MG/2ML IJ SOLN
4.0000 mg | Freq: Once | INTRAMUSCULAR | Status: AC
  Administered 2019-07-29: 23:00:00 4 mg via INTRAVENOUS
  Filled 2019-07-29: qty 2

## 2019-07-29 MED ORDER — SODIUM CHLORIDE 0.9 % IV BOLUS
1000.0000 mL | Freq: Once | INTRAVENOUS | Status: AC
  Administered 2019-07-29: 23:00:00 1000 mL via INTRAVENOUS

## 2019-07-29 NOTE — ED Notes (Signed)
Patient transported to X-ray & CT °

## 2019-07-29 NOTE — ED Notes (Signed)
Pt unable to provide enough urine to complete ordered testing. Encouraged to try again later. Will continue to monitor.

## 2019-07-29 NOTE — ED Triage Notes (Addendum)
Pt c/o swelling in bilateral LE for 3 weeks. Pt states it started after being treated for a dental abscess. Pt has associated muscle aches and bruising in bilateral LE. Pt states she has been having decreased appetite, and dizziness.

## 2019-07-29 NOTE — Discharge Instructions (Addendum)
You were seen in the ED today with nausea, vomiting, leg cramping, and bruising. Your labs show slightly low potassium which may be causing your symptoms. Please call the PCP listed to schedule the next available appointment. Return to the ED with any new or suddenly worsening symptoms.

## 2019-07-29 NOTE — ED Notes (Signed)
Pt given PO challenge.

## 2019-07-29 NOTE — ED Provider Notes (Signed)
Emergency Department Provider Note   I have reviewed the triage vital signs and the nursing notes.   HISTORY  Chief Complaint No chief complaint on file.   HPI Ashley Trevino is a 33 y.o. female with past history of migraine headache, poor dentition, and scoliosis presents to the emergency department with diffuse muscle cramping, bruising, bilateral leg swelling, lightheadedness.  Symptoms have been worsening abruptly over the past week.  She reports recent evaluation for chronic dental pain and abscess.  She states that she saw a dentist who started her on Augmentin for 2 weeks which she finished approximately 3 months ago.  She states that they wanted to pull multiple teeth but she did not have the money so continues to treat these abscesses by "popping them."  She denies current abscess but in the setting has developed diffuse muscle cramping and bruising.  She is not falling or injuring herself.  She is finding it increasingly painful to walk and feels lightheaded with standing.  She does report some intermittent chest discomfort but symptoms are not reproducible.  No pleuritic chest pain or shortness of breath.  She denies fevers.  Patient does not drink alcohol or use drugs.  She reports vomiting as well over the past week but is able to tolerate liquids.  She has been trying Zofran at home with no relief.  Denies tick bites.  Patient's bruising is mostly in the upper thighs but also over the abdomen and occasional bruising in the arms. No palms/soles involvement.    Past Medical History:  Diagnosis Date   Back pain    Migraine    Scoliosis     There are no active problems to display for this patient.   Past Surgical History:  Procedure Laterality Date   FOOT SURGERY      Allergies Patient has no known allergies.  No family history on file.  Social History Social History   Tobacco Use   Smoking status: Current Every Day Smoker    Packs/day: 0.00   Smokeless  tobacco: Never Used  Substance Use Topics   Alcohol use: No   Drug use: No    Review of Systems  Constitutional: No fever/chills. Positive weakness, muscle aches, and lightheadedness.  Eyes: No visual changes. ENT: No sore throat. Intermittent dental pain and abscess.  Cardiovascular: Intermittent chest pain. Respiratory: Denies shortness of breath. Gastrointestinal: No abdominal pain. Positive nausea and vomiting.  No diarrhea.  No constipation. Genitourinary: Negative for dysuria. Musculoskeletal: Diffuse muscle aches worse in the thighs.  Skin: Bruising in the bilateral thighs. Neurological: Negative for focal weakness or numbness. Tingling in the bilateral LEs. History of chronic headaches.   10-point ROS otherwise negative.  ____________________________________________   PHYSICAL EXAM:  VITAL SIGNS: ED Triage Vitals  Enc Vitals Group     BP 07/29/19 2127 111/78     Pulse Rate 07/29/19 2127 97     Resp 07/29/19 2127 20     Temp 07/29/19 2127 98.6 F (37 C)     Temp Source 07/29/19 2127 Oral     SpO2 07/29/19 2127 100 %     Weight 07/29/19 2129 125 lb (56.7 kg)     Height 07/29/19 2129 5\' 6"  (1.676 m)   Constitutional: Alert and oriented. Well appearing and in no acute distress. Eyes: Conjunctivae are normal. PERRL. EOMI. Head: Atraumatic. Nose: No congestion/rhinnorhea. Mouth/Throat: Mucous membranes are moist.   Neck: No stridor.  Cardiovascular: Normal rate, regular rhythm. Good peripheral circulation. Grossly normal  heart sounds. No murmurs.  Respiratory: Normal respiratory effort.  No retractions. Lungs CTAB. Gastrointestinal: Soft and nontender. No distention.  Musculoskeletal: Mild swelling in the bilateral LEs with tenderness to palpation of the thighs. No joint redness or swelling.  Neurologic:  Normal speech and language. Normal CN exam 2-12. Bilateral upper extremity tremor/asterixis type movement. Normal finger to nose despite. Decreased strength  throughout without unilateral weakness. Hesitant, wide based gait without assistance.  Skin:  Skin is warm and dry. Ecchymosis in the thighs and left upper arm.    ____________________________________________   LABS (all labs ordered are listed, but only abnormal results are displayed)  Labs Reviewed  COMPREHENSIVE METABOLIC PANEL - Abnormal; Notable for the following components:      Result Value   Potassium 2.9 (*)    Glucose, Bld 107 (*)    BUN 5 (*)    Calcium 8.3 (*)    Total Protein 5.8 (*)    Albumin 3.0 (*)    All other components within normal limits  CBC WITH DIFFERENTIAL/PLATELET - Abnormal; Notable for the following components:   MCV 102.5 (*)    All other components within normal limits  BRAIN NATRIURETIC PEPTIDE - Abnormal; Notable for the following components:   B Natriuretic Peptide 160.6 (*)    All other components within normal limits  URINALYSIS, ROUTINE W REFLEX MICROSCOPIC - Abnormal; Notable for the following components:   pH 8.5 (*)    All other components within normal limits  RAPID URINE DRUG SCREEN, HOSP PERFORMED - Abnormal; Notable for the following components:   Opiates POSITIVE (*)    Benzodiazepines POSITIVE (*)    All other components within normal limits  LIPASE, BLOOD  PROTIME-INR  MAGNESIUM  CK  AMMONIA  PREGNANCY, URINE  TSH  TROPONIN I (HIGH SENSITIVITY)  TROPONIN I (HIGH SENSITIVITY)   ____________________________________________  EKG   EKG Interpretation  Date/Time:  Sunday July 29 2019 22:42:46 EDT Ventricular Rate:  80 PR Interval:    QRS Duration: 107 QT Interval:  400 QTC Calculation: 462 R Axis:   32 Text Interpretation:  Sinus rhythm Borderline short PR interval RSR' in V1 or V2, probably normal variant Borderline T abnormalities, anterior leads No STEMI  Confirmed by Alona Bene [phone]) on 07/29/2019 11:20:32 PM       ____________________________________________  RADIOLOGY  Dg Chest 2 View  Result  Date: 07/29/2019 CLINICAL DATA:  Chest pain and dry cough for 3 days EXAM: CHEST - 2 VIEW COMPARISON:  Radiograph 08/15/2016 FINDINGS: No consolidation, features of edema, pneumothorax, or effusion. Pulmonary vascularity is normally distributed. The cardiomediastinal contours are unremarkable. No acute osseous or soft tissue abnormality. IMPRESSION: No acute cardiopulmonary abnormality. Electronically Signed   By: Kreg Shropshire M.D.   On: 07/29/2019 22:40   Ct Head Wo Contrast  Result Date: 07/29/2019 CLINICAL DATA:  Altered level of consciousness EXAM: CT HEAD WITHOUT CONTRAST TECHNIQUE: Contiguous axial images were obtained from the base of the skull through the vertex without intravenous contrast. COMPARISON:  05/21/2012 FINDINGS: Brain: No evidence of acute infarction, hemorrhage, hydrocephalus, extra-axial collection or mass lesion/mass effect. Vascular: No hyperdense vessel or unexpected calcification. Skull: Normal. Negative for fracture or focal lesion. Sinuses/Orbits: No acute finding. Other: None. IMPRESSION: Normal head CT for age Electronically Signed   By: Alcide Clever M.D.   On: 07/29/2019 22:38    ____________________________________________   PROCEDURES  Procedure(s) performed:   Procedures  None ____________________________________________   INITIAL IMPRESSION / ASSESSMENT AND PLAN / ED  COURSE  Pertinent labs & imaging results that were available during my care of the patient were reviewed by me and considered in my medical decision making (see chart for details).   Patient presents to the emergency department for evaluation of a constellation of symptoms including unexplained bruising, body aches, nausea/vomiting, lightheadedness, and CP.  No vital sign abnormalities.  This symptom constellation began in the setting of chronic dental pain and intermittent abscess but no recent antibiotics.  Patient has no focal deficits but some tremor/asterixis type movement in the upper  extremities.  Lower suspicion clinically for MS or other neurodegenerative pathology.  No known tick bites.  No meningitis type symptoms.  CT imaging of the head along with chest x-ray obtained with no acute findings.  Will give IV fluids and nausea medication while obtaining screening lab work including ammonia, CK, chemistries, CBC, INR, and troponin.   Patient's labs show mild hypokalemia. Will replace with PO and PO challenge. UA, Utox, and pregnancy outstanding. Platelets normal along with INR. CK and ammonia are normal.   U tox and PO challenge pending. Care transferred to Dr. Wilkie AyeHorton.  ____________________________________________  FINAL CLINICAL IMPRESSION(S) / ED DIAGNOSES  Final diagnoses:  Non-intractable vomiting with nausea, unspecified vomiting type  Hypokalemia  Muscle cramping  Bruising     MEDICATIONS GIVEN DURING THIS VISIT:  Medications  sodium chloride 0.9 % bolus 1,000 mL (0 mLs Intravenous Stopped 07/30/19 0056)  ondansetron (ZOFRAN) injection 4 mg (4 mg Intravenous Given 07/29/19 2245)  potassium chloride SA (KLOR-CON) CR tablet 40 mEq (40 mEq Oral Given 07/29/19 2343)     NEW OUTPATIENT MEDICATIONS STARTED DURING THIS VISIT:  Discharge Medication List as of 07/30/2019 12:50 AM    START taking these medications   Details  potassium chloride SA (KLOR-CON) 20 MEQ tablet Take 2 tablets (40 mEq total) by mouth daily for 4 days., Starting Mon 07/30/2019, Until Fri 08/03/2019, Print        Note:  This document was prepared using Dragon voice recognition software and may include unintentional dictation errors.  Alona BeneJoshua Deadra Diggins, MD, [facility]    Francys Bolin, Arlyss RepressJoshua G, MD 07/30/19 [phone]

## 2019-07-29 NOTE — ED Notes (Addendum)
Girlfriend reports recurrent oral abscesses, dizziness, bruising on BIL LE, edema to BIL LE. Girlfriend also reports "forgetfullness" after hitting her head 3 weeks ago and "not being able to keep any food down" but reports she is able to keep down liquids.

## 2019-07-30 LAB — TSH: TSH: 0.671 u[IU]/mL (ref 0.350–4.500)

## 2021-03-02 IMAGING — CT CT HEAD W/O CM
3 series · 16 of 47 positions shown, 19 images · non-contrast
Comparison: 05/21/2012

CLINICAL DATA: Altered level of consciousness

EXAM:
CT HEAD WITHOUT CONTRAST
TECHNIQUE: Contiguous axial images were obtained from the base of the skull
through the vertex without intravenous contrast.

[Series 2: head wo · axial · 0.43mm/px · z∈[-170,-45]mm · 10 of 31 slices shown, 13 images]
[im 3/31  brain]
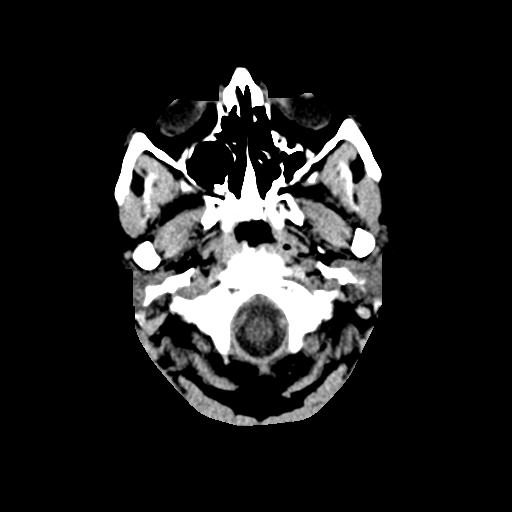
[im 3/31  bone]
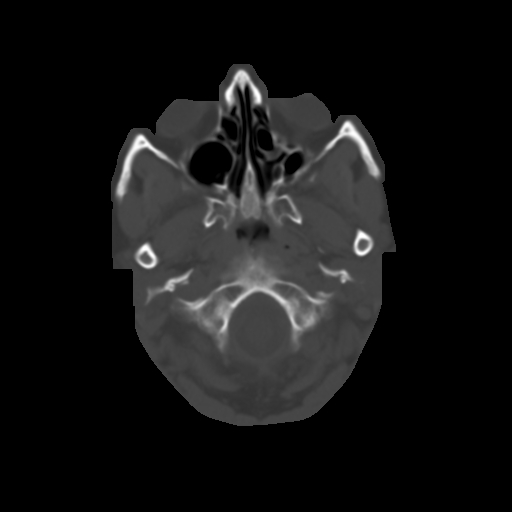
[im 6/31  brain]
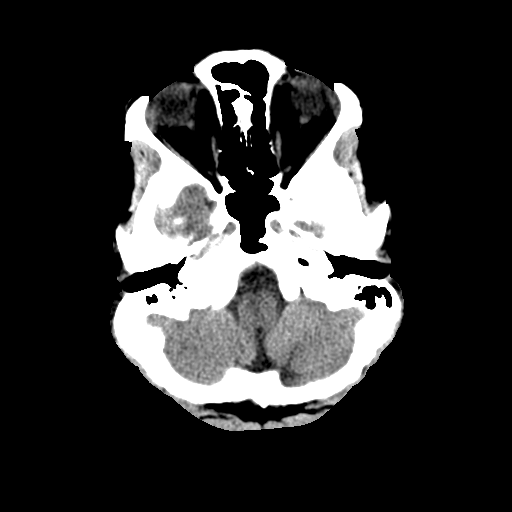
[im 9/31  brain]
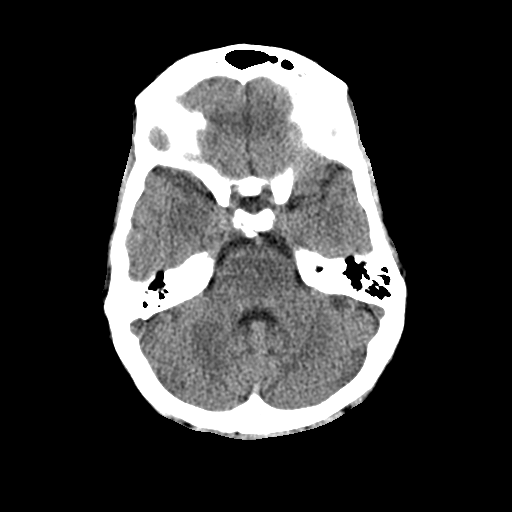
[im 11/31  brain]
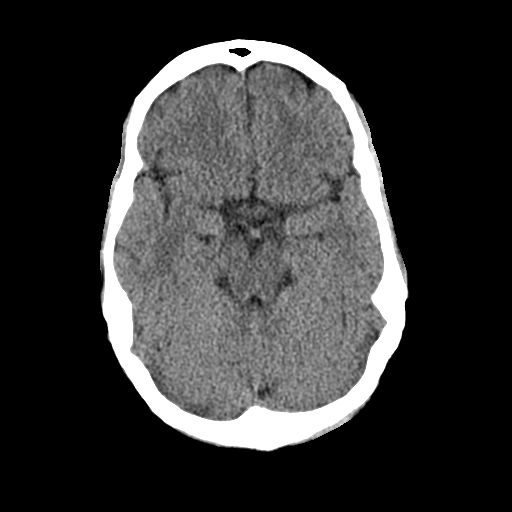
[im 14/31  brain]
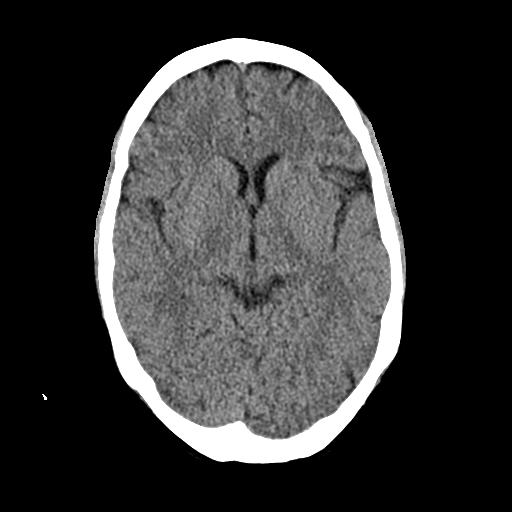
[im 14/31  bone]
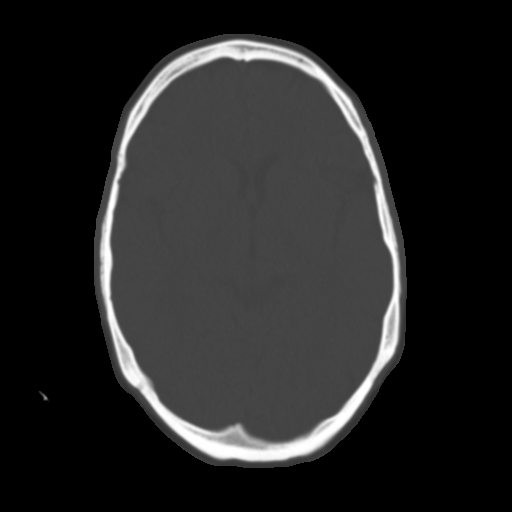
[im 17/31  brain]
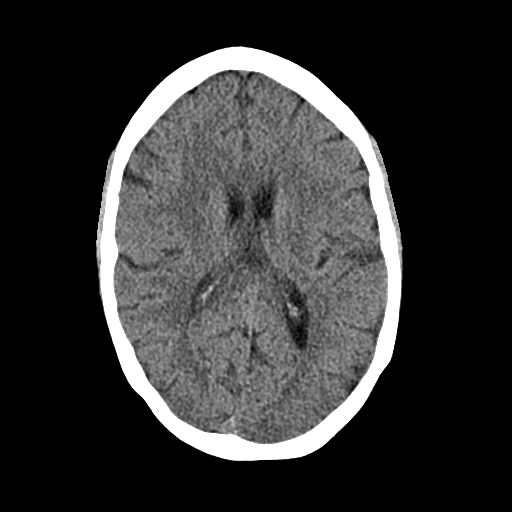
[im 20/31  brain]
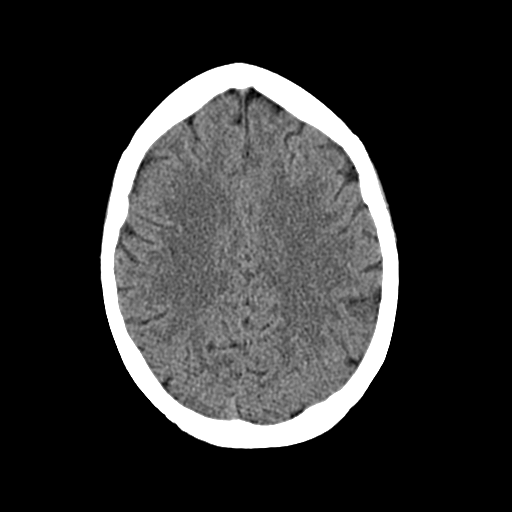
[im 23/31  brain]
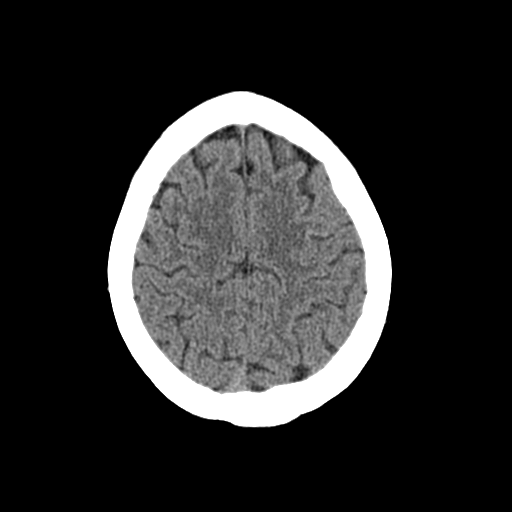
[im 25/31  brain]
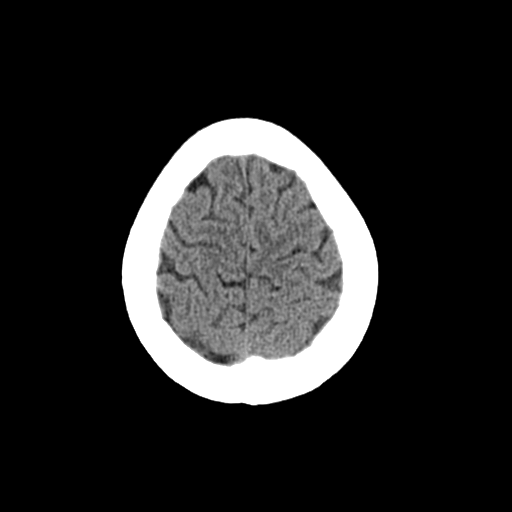
[im 25/31  bone]
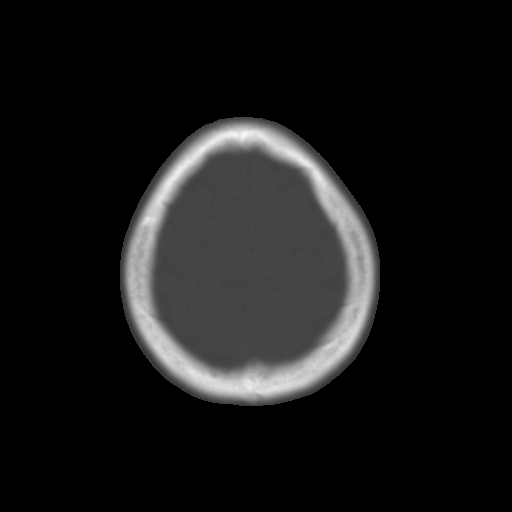
[im 28/31  brain]
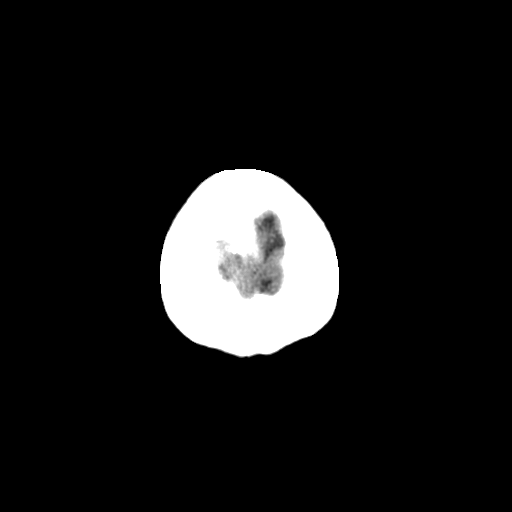

[Series 4: cor soft · coronal · 0.31mm/px · 3 of 64 slices shown]
[im 22/64  brain]
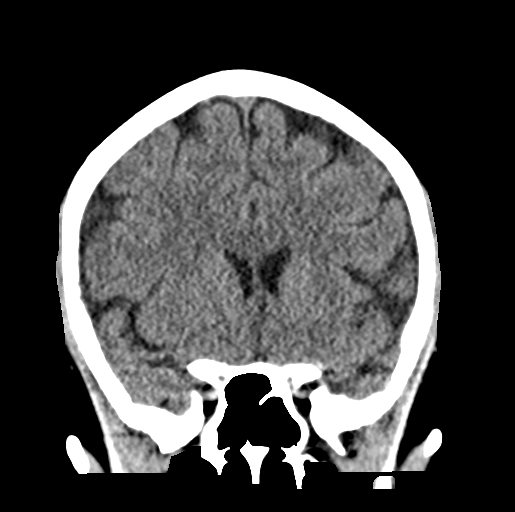
[im 29/64  brain]
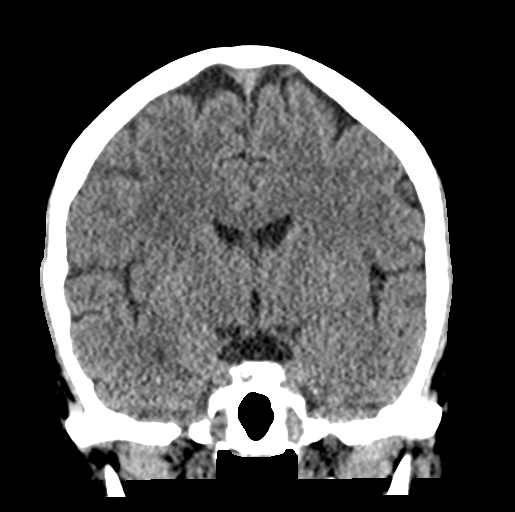
[im 36/64  brain]
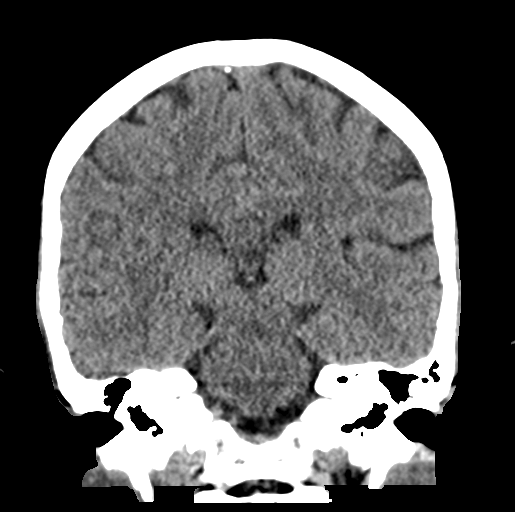

[Series 5: sag soft · sagittal · 0.31mm/px · 3 of 53 slices shown]
[im 18/53  brain]
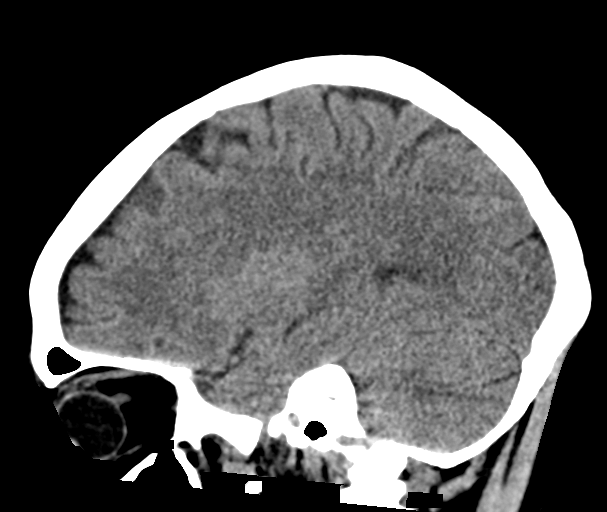
[im 27/53  brain]
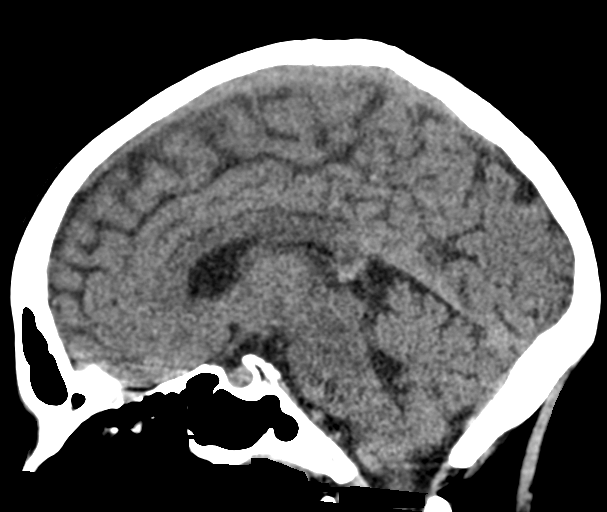
[im 35/53  brain]
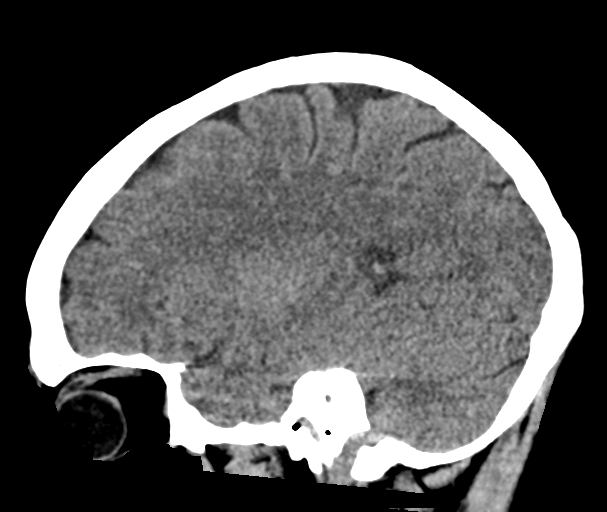

[16 of 47 positions shown; findings below may reference images not displayed]

FINDINGS: Brain: No evidence of acute infarction, hemorrhage, hydrocephalus,
extra-axial collection or mass lesion/mass effect.

Vascular: No hyperdense vessel or unexpected calcification.

Skull: Normal. Negative for fracture or focal lesion.

Sinuses/Orbits: No acute finding.

Other: None.
IMPRESSION: Normal head CT for age
